# Patient Record
Sex: Female | Born: 1965 | Race: White | Hispanic: Yes | Marital: Single | Smoking: Never smoker
Health system: Southern US, Community
[De-identification: ages and names within clinical notes are randomized; demographics above are authoritative.]

## PROBLEM LIST (undated history)

## (undated) DIAGNOSIS — H9190 Unspecified hearing loss, unspecified ear: Secondary | ICD-10-CM

## (undated) HISTORY — PX: NO PAST SURGERIES: SHX2092

---

## 1999-05-11 ENCOUNTER — Ambulatory Visit (HOSPITAL_COMMUNITY): Admission: RE | Admit: 1999-05-11 | Discharge: 1999-05-11 | Payer: Self-pay | Admitting: *Deleted

## 1999-07-13 ENCOUNTER — Encounter (HOSPITAL_COMMUNITY): Admission: RE | Admit: 1999-07-13 | Discharge: 1999-07-17 | Payer: Self-pay | Admitting: Obstetrics

## 1999-07-16 ENCOUNTER — Inpatient Hospital Stay (HOSPITAL_COMMUNITY): Admission: AD | Admit: 1999-07-16 | Discharge: 1999-07-19 | Payer: Self-pay | Admitting: Obstetrics & Gynecology

## 2000-02-06 ENCOUNTER — Emergency Department (HOSPITAL_COMMUNITY): Admission: EM | Admit: 2000-02-06 | Discharge: 2000-02-06 | Payer: Self-pay | Admitting: Emergency Medicine

## 2000-02-06 ENCOUNTER — Encounter: Payer: Self-pay | Admitting: Emergency Medicine

## 2006-05-17 ENCOUNTER — Inpatient Hospital Stay (HOSPITAL_COMMUNITY): Admission: AD | Admit: 2006-05-17 | Discharge: 2006-05-17 | Payer: Self-pay | Admitting: Obstetrics and Gynecology

## 2020-03-26 ENCOUNTER — Emergency Department (HOSPITAL_COMMUNITY): Payer: Self-pay

## 2020-03-26 ENCOUNTER — Encounter (HOSPITAL_COMMUNITY): Payer: Self-pay

## 2020-03-26 ENCOUNTER — Other Ambulatory Visit: Payer: Self-pay

## 2020-03-26 ENCOUNTER — Observation Stay (HOSPITAL_COMMUNITY)
Admission: EM | Admit: 2020-03-26 | Discharge: 2020-03-28 | Disposition: A | Discharge status: Home / Self Care | Payer: Self-pay | Attending: Physician Assistant | Admitting: Physician Assistant

## 2020-03-26 DIAGNOSIS — Z419 Encounter for procedure for purposes other than remedying health state, unspecified: Secondary | ICD-10-CM

## 2020-03-26 DIAGNOSIS — K819 Cholecystitis, unspecified: Secondary | ICD-10-CM

## 2020-03-26 DIAGNOSIS — Z20822 Contact with and (suspected) exposure to covid-19: Secondary | ICD-10-CM | POA: Insufficient documentation

## 2020-03-26 DIAGNOSIS — K810 Acute cholecystitis: Principal | ICD-10-CM | POA: Insufficient documentation

## 2020-03-26 HISTORY — DX: Unspecified hearing loss, unspecified ear: H91.90

## 2020-03-26 LAB — COMPREHENSIVE METABOLIC PANEL
ALT: 37 U/L (ref 0–44)
AST: 50 U/L — ABNORMAL HIGH (ref 15–41)
Albumin: 4.3 g/dL (ref 3.5–5.0)
Alkaline Phosphatase: 78 U/L (ref 38–126)
Anion gap: 13 (ref 5–15)
BUN: 13 mg/dL (ref 6–20)
CO2: 25 mmol/L (ref 22–32)
Calcium: 9.4 mg/dL (ref 8.9–10.3)
Chloride: 99 mmol/L (ref 98–111)
Creatinine, Ser: 0.8 mg/dL (ref 0.44–1.00)
GFR, Estimated: >60 mL/min (ref >60)
Glucose, Bld: 141 mg/dL — ABNORMAL HIGH (ref 70–99)
Potassium: 4.7 mmol/L (ref 3.5–5.1)
Sodium: 137 mmol/L (ref 135–145)
Total Bilirubin: 2.4 mg/dL — ABNORMAL HIGH (ref 0.3–1.2)
Total Protein: 8.4 g/dL — ABNORMAL HIGH (ref 6.5–8.1)

## 2020-03-26 LAB — URINALYSIS, ROUTINE W REFLEX MICROSCOPIC
Bacteria, UA: NONE SEEN
Bilirubin Urine: NEGATIVE
Glucose, UA: NEGATIVE mg/dL
Ketones, ur: NEGATIVE mg/dL
Leukocytes,Ua: NEGATIVE
Nitrite: NEGATIVE
Protein, ur: NEGATIVE mg/dL
Specific Gravity, Urine: 1.014 (ref 1.005–1.030)
pH: 6 (ref 5.0–8.0)

## 2020-03-26 LAB — RESPIRATORY PANEL BY RT PCR (FLU A&B, COVID)
Influenza A by PCR: NEGATIVE
Influenza B by PCR: NEGATIVE
SARS Coronavirus 2 by RT PCR: NEGATIVE

## 2020-03-26 LAB — CBC
HCT: 46 % (ref 36.0–46.0)
Hemoglobin: 15.2 g/dL — ABNORMAL HIGH (ref 12.0–15.0)
MCH: 28.4 pg (ref 26.0–34.0)
MCHC: 33 g/dL (ref 30.0–36.0)
MCV: 85.8 fL (ref 80.0–100.0)
Platelets: 372 10*3/uL (ref 150–400)
RBC: 5.36 MIL/uL — ABNORMAL HIGH (ref 3.87–5.11)
RDW: 13.1 % (ref 11.5–15.5)
WBC: 23.9 10*3/uL — ABNORMAL HIGH (ref 4.0–10.5)
nRBC: 0 % (ref 0.0–0.2)

## 2020-03-26 LAB — I-STAT BETA HCG BLOOD, ED (MC, WL, AP ONLY): I-stat hCG, quantitative: <5 m[IU]/mL (ref <5)

## 2020-03-26 LAB — LIPASE, BLOOD: Lipase: 22 U/L (ref 11–51)

## 2020-03-26 MED ORDER — DOCUSATE SODIUM 100 MG PO CAPS
100.0000 mg | ORAL_CAPSULE | Freq: Two times a day (BID) | ORAL | Status: DC
  Administered 2020-03-26 – 2020-03-28 (×3): 100 mg via ORAL
  Filled 2020-03-26 (×3): qty 1

## 2020-03-26 MED ORDER — OXYCODONE HCL 5 MG PO TABS
5.0000 mg | ORAL_TABLET | ORAL | Status: DC | PRN
  Administered 2020-03-26: 18:00:00 10 mg via ORAL
  Administered 2020-03-28: 5 mg via ORAL
  Filled 2020-03-26: qty 1
  Filled 2020-03-26: qty 2

## 2020-03-26 MED ORDER — ACETAMINOPHEN 325 MG PO TABS
650.0000 mg | ORAL_TABLET | Freq: Four times a day (QID) | ORAL | Status: DC | PRN
  Administered 2020-03-27: 650 mg via ORAL
  Filled 2020-03-26: qty 2

## 2020-03-26 MED ORDER — METOPROLOL TARTRATE 5 MG/5ML IV SOLN: 5.0000 mg | Freq: Four times a day (QID) | INTRAVENOUS | Status: DC | PRN

## 2020-03-26 MED ORDER — DIPHENHYDRAMINE HCL 50 MG/ML IJ SOLN: 25.0000 mg | Freq: Four times a day (QID) | INTRAMUSCULAR | Status: DC | PRN

## 2020-03-26 MED ORDER — POLYETHYLENE GLYCOL 3350 17 G PO PACK: 17.0000 g | PACK | Freq: Every day | ORAL | Status: DC | PRN

## 2020-03-26 MED ORDER — ONDANSETRON HCL 4 MG/2ML IJ SOLN
4.0000 mg | Freq: Once | INTRAMUSCULAR | Status: AC
  Administered 2020-03-26: 4 mg via INTRAVENOUS
  Filled 2020-03-26: qty 2

## 2020-03-26 MED ORDER — METHOCARBAMOL 500 MG PO TABS: 500.0000 mg | ORAL_TABLET | Freq: Four times a day (QID) | ORAL | Status: DC | PRN

## 2020-03-26 MED ORDER — INFLUENZA VAC SPLIT QUAD 0.5 ML IM SUSY
0.5000 mL | PREFILLED_SYRINGE | INTRAMUSCULAR | Status: DC
  Filled 2020-03-26: qty 0.5

## 2020-03-26 MED ORDER — ACETAMINOPHEN 500 MG PO TABS
1000.0000 mg | ORAL_TABLET | ORAL | Status: AC
  Administered 2020-03-27: 1000 mg via ORAL
  Filled 2020-03-26: qty 2

## 2020-03-26 MED ORDER — BISACODYL 10 MG RE SUPP: 10.0000 mg | Freq: Every day | RECTAL | Status: DC | PRN

## 2020-03-26 MED ORDER — IBUPROFEN 200 MG PO TABS: 600.0000 mg | ORAL_TABLET | Freq: Four times a day (QID) | ORAL | Status: DC | PRN

## 2020-03-26 MED ORDER — DIPHENHYDRAMINE HCL 25 MG PO CAPS: 25.0000 mg | ORAL_CAPSULE | Freq: Four times a day (QID) | ORAL | Status: DC | PRN

## 2020-03-26 MED ORDER — SODIUM CHLORIDE 0.9 % IV SOLN
2.0000 g | Freq: Once | INTRAVENOUS | Status: AC
  Administered 2020-03-26: 2 g via INTRAVENOUS
  Filled 2020-03-26: qty 20

## 2020-03-26 MED ORDER — PANTOPRAZOLE SODIUM 40 MG IV SOLR
40.0000 mg | Freq: Every day | INTRAVENOUS | Status: DC
  Administered 2020-03-26 – 2020-03-27 (×2): 40 mg via INTRAVENOUS
  Filled 2020-03-26 (×2): qty 40

## 2020-03-26 MED ORDER — MORPHINE SULFATE (PF) 2 MG/ML IV SOLN: 2.0000 mg | INTRAVENOUS | Status: DC | PRN

## 2020-03-26 MED ORDER — ENOXAPARIN SODIUM 40 MG/0.4ML ~~LOC~~ SOLN
40.0000 mg | SUBCUTANEOUS | Status: DC
  Administered 2020-03-26 – 2020-03-27 (×2): 40 mg via SUBCUTANEOUS
  Filled 2020-03-26 (×2): qty 0.4

## 2020-03-26 MED ORDER — ONDANSETRON 4 MG PO TBDP: 4.0000 mg | ORAL_TABLET | Freq: Four times a day (QID) | ORAL | Status: DC | PRN

## 2020-03-26 MED ORDER — HYDROMORPHONE HCL 1 MG/ML IJ SOLN
1.0000 mg | Freq: Once | INTRAMUSCULAR | Status: AC
  Administered 2020-03-26: 1 mg via INTRAVENOUS
  Filled 2020-03-26: qty 1

## 2020-03-26 MED ORDER — ACETAMINOPHEN 650 MG RE SUPP: 650.0000 mg | Freq: Four times a day (QID) | RECTAL | Status: DC | PRN

## 2020-03-26 MED ORDER — GABAPENTIN 300 MG PO CAPS
300.0000 mg | ORAL_CAPSULE | ORAL | Status: AC
  Administered 2020-03-27: 300 mg via ORAL
  Filled 2020-03-26: qty 1

## 2020-03-26 MED ORDER — IBUPROFEN 400 MG PO TABS
600.0000 mg | ORAL_TABLET | Freq: Four times a day (QID) | ORAL | Status: DC | PRN
  Administered 2020-03-26: 600 mg via ORAL
  Filled 2020-03-26: qty 1

## 2020-03-26 MED ORDER — ONDANSETRON HCL 4 MG/2ML IJ SOLN: 4.0000 mg | Freq: Four times a day (QID) | INTRAMUSCULAR | Status: DC | PRN

## 2020-03-26 MED ORDER — SODIUM CHLORIDE 0.9 % IV SOLN
1000.0000 mL | INTRAVENOUS | Status: DC
  Administered 2020-03-26 – 2020-03-28 (×4): 1000 mL via INTRAVENOUS

## 2020-03-26 MED ORDER — SODIUM CHLORIDE 0.9 % IV BOLUS (SEPSIS)
1000.0000 mL | Freq: Once | INTRAVENOUS | Status: AC
  Administered 2020-03-26: 1000 mL via INTRAVENOUS

## 2020-03-26 MED ORDER — SODIUM CHLORIDE 0.9 % IV SOLN
2.0000 g | INTRAVENOUS | Status: DC
  Administered 2020-03-27: 2 g via INTRAVENOUS
  Filled 2020-03-26 (×2): qty 20

## 2020-03-26 MED ORDER — SIMETHICONE 80 MG PO CHEW
40.0000 mg | CHEWABLE_TABLET | Freq: Four times a day (QID) | ORAL | Status: DC | PRN
  Filled 2020-03-26: qty 1

## 2020-03-26 MED ORDER — CELECOXIB 200 MG PO CAPS
400.0000 mg | ORAL_CAPSULE | ORAL | Status: AC
  Administered 2020-03-27: 400 mg via ORAL
  Filled 2020-03-26: qty 2

## 2020-03-26 NOTE — ED Notes (Addendum)
Report called to 1318 RN Dahlia Client

## 2020-03-26 NOTE — ED Provider Notes (Signed)
[facility]-EMERGENCY DEPT Provider Note   CSN: [id number] Arrival date & time: 03/26/20  1158   Sign language interpreter used  History Chief Complaint  Patient presents with   Abdominal Pain    Yesenia Olson is a 54 y.o. female.  HPI   Patient presents ED for evaluation of abdominal pain.  Patient had acute onset of pain this morning at about 930.  She started having nausea and vomiting.  Patient also felt very lightheaded.  Patient states the pain is sharp in the right upper quadrant.  Nothing seems to be helping.  She denies any diarrhea.  No dysuria.  She denies any prior abdominal surgeries  History reviewed. No pertinent past medical history.  There are no problems to display for this patient.   History reviewed. No pertinent surgical history.   OB History   No obstetric history on file.     No family history on file.  Social History   Tobacco Use   Smoking status: Never Smoker   Smokeless tobacco: Never Used  Substance Use Topics   Alcohol use: Never   Drug use: Never    Home Medications Prior to Admission medications   Not on File    Allergies    Patient has no known allergies.  Review of Systems   Review of Systems  All other systems reviewed and are negative.   Physical Exam Updated Vital Signs BP (!) 102/51    Pulse (!) 115    Temp 98.7 F (37.1 C) (Oral)    Resp 16    SpO2 98%   Physical Exam Vitals and nursing note reviewed.  Constitutional:      General: She is not in acute distress.    Appearance: She is well-developed.  HENT:     Head: Normocephalic and atraumatic.     Right Ear: External ear normal.     Left Ear: External ear normal.  Eyes:     General: No scleral icterus.       Right eye: No discharge.        Left eye: No discharge.     Conjunctiva/sclera: Conjunctivae normal.  Neck:     Trachea: No tracheal deviation.  Cardiovascular:     Rate and Rhythm: Normal rate and regular rhythm.    Pulmonary:     Effort: Pulmonary effort is normal. No respiratory distress.     Breath sounds: Normal breath sounds. No stridor. No wheezing or rales.  Abdominal:     General: Bowel sounds are normal. There is no distension.     Palpations: Abdomen is soft.     Tenderness: There is abdominal tenderness in the right upper quadrant. There is guarding. There is no rebound. Positive signs include Murphy's sign.  Musculoskeletal:        General: No tenderness.     Cervical back: Neck supple.  Skin:    General: Skin is warm and dry.     Findings: No rash.  Neurological:     Mental Status: She is alert.     Cranial Nerves: No cranial nerve deficit (no facial droop, extraocular movements intact, no slurred speech).     Sensory: No sensory deficit.     Motor: No abnormal muscle tone or seizure activity.     Coordination: Coordination normal.     ED Results / Procedures / Treatments   Labs (all labs ordered are listed, but only abnormal results are displayed) Labs Reviewed  COMPREHENSIVE METABOLIC PANEL -  Abnormal; Notable for the following components:      Result Value   Glucose, Bld 141 (*)    Total Protein 8.4 (*)    AST 50 (*)    Total Bilirubin 2.4 (*)    All other components within normal limits  CBC - Abnormal; Notable for the following components:   WBC 23.9 (*)    RBC 5.36 (*)    Hemoglobin 15.2 (*)    All other components within normal limits  URINALYSIS, ROUTINE W REFLEX MICROSCOPIC - Abnormal; Notable for the following components:   Hgb urine dipstick MODERATE (*)    All other components within normal limits  RESPIRATORY PANEL BY RT PCR (FLU A&B, COVID)  LIPASE, BLOOD  I-STAT BETA HCG BLOOD, ED ([facility], [facility], [facility] ONLY)    EKG None  Radiology US Abdomen Limited  Result Date: 03/26/2020 CLINICAL DATA:  Right upper quadrant pain EXAM: ULTRASOUND ABDOMEN LIMITED RIGHT UPPER QUADRANT COMPARISON:  None. FINDINGS: Gallbladder: Gallbladder is contracted and filled with  calculi and sludge. Gallbladder wall appears somewhat thickened. No pericholecystic fluid. No sonographic Murphy sign noted by sonographer. Common bile duct: Diameter: 4 mm. No intrahepatic or extrahepatic biliary duct dilatation. Liver: No focal lesion identified. Within normal limits in parenchymal echogenicity. Portal vein is patent on color Doppler imaging with normal direction of blood flow towards the liver. Other: None. IMPRESSION: Gallbladder contracted and filled with calculi and sludge. The gallbladder wall is thickened. These findings are concerning for potential degree of acute cholecystitis. Study otherwise unremarkable. Electronically Signed   By: Bretta Bang III M.D.   On: 03/26/2020 16:02    Procedures Procedures (including critical care time)  Medications Ordered in ED Medications  sodium chloride 0.9 % bolus 1,000 mL (0 mLs Intravenous Stopped 03/26/20 1519)    Followed by  0.9 %  sodium chloride infusion (has no administration in time range)  HYDROmorphone (DILAUDID) injection 1 mg (1 mg Intravenous Given 03/26/20 1352)  ondansetron (ZOFRAN) injection 4 mg (4 mg Intravenous Given 03/26/20 1348)  cefTRIAXone (ROCEPHIN) 2 g in sodium chloride 0.9 % 100 mL IVPB (0 g Intravenous Stopped 03/26/20 1432)    ED Course  I have reviewed the triage vital signs and the nursing notes.  Pertinent labs & imaging results that were available during my care of the patient were reviewed by me and considered in my medical decision making (see chart for details).  Clinical Course as of Mar 26 1626  Wed Mar 26, 2020  1338 Symptoms concerning for cholecystitis.  Will proceed with ultrasound   [JK]  1624 [place] confirms cholecystitis.  Pt has been given IV abx.  Discussed findings with patient and son.  Gen surg consulted.   [JK]    Clinical Course User Index [JK] Linwood Dibbles, MD   MDM Rules/Calculators/A&P                          Patient presented to the ED for evaluation of abdominal  pain.  Patient had significant tenderness to the right upper quadrant.  Labs were notable for slight increase in the bilirubin as well as a leukocytosis.  Ultrasound was performed and the patient has evidence of acute cholecystitis.  Patient has remained hemodynamically stable.  No signs of sepsis.  Note blood pressure of 77/62 at 1425 was erroneous.  Patient has remained normotensive throughout.  General surgery has been consulted.  Plan is admission for further treatment. Final Clinical Impression(s) / ED  Diagnoses Final diagnoses:  Cholecystitis      Linwood Dibbles, MD 03/26/20 [phone]

## 2020-03-26 NOTE — ED Triage Notes (Signed)
Pt presents with c/o abdominal pain that started around 9:30 this morning, associated with vomiting. ASL interpreter used.

## 2020-03-26 NOTE — H&P (Signed)
[facility] Admission Note  Yesenia Olson 09-14-1965  [id number].    Requesting MD: Dorie Rank Chief Complaint/Reason for Consult: cholecystitis  HPI:  Yesenia Olson is a 54yo female with no significant PMH who presented to [facility] today complaining of acute onset abdominal pain. States that this episode began last night. She ate a frozen meal for dinner, then was woken up from sleep with RUQ abdominal pain. Pain is constant, severe. Sometimes radiates across her upper abdomen. Associated with nausea, vomiting, and chills. No known fever. She reports having 1 similar episode about 2 days ago, but the pain was less severe and resolved without intervention.  States that she felt lightheaded and passed out in the bathroom, hitting her head on the bathroom sink. Denies headache, blurry vision, neck pain, focal weakness. She called her son who brought her to the ED. ED work up included u/s which shows gallbladder contracted and filled with calculi and sludge, the gallbladder wall is thickened, concerning for potential degree of acute cholecystitis; CBD 75m diameter. WBC 23.9, AST 50, ALT 37, Alk phos 78, Tbili 2.4, lipase 22. General surgery asked to see.  Abdominal surgical history: none Anticoagulants: none Nonsmoker Lives at home alone Employment: housekeeping at [employer] covid vaccine 09/2019 NKDA  Review of Systems  Constitutional: Positive for chills and malaise/fatigue. Negative for fever.  HENT: Negative.   Eyes: Negative.   Respiratory: Negative.   Cardiovascular: Negative.   Gastrointestinal: Positive for abdominal pain, nausea and vomiting. Negative for constipation and diarrhea.  Genitourinary: Negative.   Musculoskeletal: Positive for falls.  Skin: Negative.   Neurological: Positive for loss of consciousness.   All systems reviewed and otherwise negative except for as above  No family history on file.  History reviewed. No pertinent past  medical history.  History reviewed. No pertinent surgical history.  Social History:  reports that she has never smoked. She has never used smokeless tobacco. She reports that she does not drink alcohol and does not use drugs.  Allergies: No Known Allergies  (Not in a hospital admission)   Prior to Admission medications   Not on File    Blood pressure (!) 102/51, pulse (!) 115, temperature 98.7 F (37.1 C), temperature source Oral, resp. rate 16, SpO2 98 %. Physical Exam: General: pleasant, WD/WN white female who is laying in bed in NAD HEENT: head is normocephalic. Mild erythema noted to frontal forehead.  Sclera are noninjected.  PERRL. EOMs intact.  Ears and nose without any masses or lesions.  Mouth is pink and moist. Dentition fair Heart: regular, rate, and rhythm.  Normal s1,s2. No obvious murmurs, gallops, or rubs noted.  Palpable pedal pulses bilaterally  Lungs: CTAB, no wheezes, rhonchi, or rales noted.  Respiratory effort nonlabored Abd: soft, ND, mild TTP RUQ without rebound or guarding, +BS, no masses, hernias, or organomegaly MS: no BUE/BLE edema, calves soft and nontender Skin: warm and dry with no masses, lesions, or rashes Psych: A&Ox4 with an appropriate affect Neuro: nonfocal exam. cranial nerves grossly intact, equal strength in BUE/BLE bilaterally, normal speech, thought process intact  Results for orders placed or performed during the hospital encounter of 03/26/20 (from the past 48 hour(s))  Lipase, blood     Status: None   Collection Time: 03/26/20 12:26 PM  Result Value Ref Range   Lipase 22 11 - 51 U/L    Comment: Performed at [facility] [address], [place] [address] Comprehensive metabolic panel  Status: Abnormal   Collection Time: 03/26/20 12:26 PM  Result Value Ref Range   Sodium 137 135 - 145 mmol/L   Potassium 4.7 3.5 - 5.1 mmol/L   Chloride 99 98 - 111 mmol/L   CO2 25 22 - 32 mmol/L   Glucose, Bld 141 (H) 70  - 99 mg/dL    Comment: Glucose reference range applies only to samples taken after fasting for at least 8 hours.   BUN 13 6 - 20 mg/dL   Creatinine, Ser 0.80 0.44 - 1.00 mg/dL   Calcium 9.4 8.9 - 10.3 mg/dL   Total Protein 8.4 (H) 6.5 - 8.1 g/dL   Albumin 4.3 3.5 - 5.0 g/dL   AST 50 (H) 15 - 41 U/L   ALT 37 0 - 44 U/L   Alkaline Phosphatase 78 38 - 126 U/L   Total Bilirubin 2.4 (H) 0.3 - 1.2 mg/dL   GFR, Estimated >60 >60 mL/min   Anion gap 13 5 - 15    Comment: Performed at [facility], [place] [address], [place], [place] [address]  CBC     Status: Abnormal   Collection Time: 03/26/20 12:26 PM  Result Value Ref Range   WBC 23.9 (H) 4.0 - 10.5 K/uL   RBC 5.36 (H) 3.87 - 5.11 MIL/uL   Hemoglobin 15.2 (H) 12.0 - 15.0 g/dL   HCT 46.0 36 - 46 %   MCV 85.8 80.0 - 100.0 fL   MCH 28.4 26.0 - 34.0 pg   MCHC 33.0 30.0 - 36.0 g/dL   RDW 13.1 11.5 - 15.5 %   Platelets 372 150 - 400 K/uL   nRBC 0.0 0.0 - 0.2 %    Comment: Performed at [facility], [place] [address], [place], [place] [address]  I-Stat beta hCG blood, ED     Status: None   Collection Time: 03/26/20 12:31 PM  Result Value Ref Range   I-stat hCG, quantitative <5.0 <5 mIU/mL   Comment 3            Comment:   GEST. AGE      CONC.  (mIU/mL)   <=1 WEEK        5 - 50     2 WEEKS       50 - 500     3 WEEKS       100 - 10,000     4 WEEKS     1,000 - 30,000        FEMALE AND NON-PREGNANT FEMALE:     LESS THAN 5 mIU/mL   Urinalysis, Routine w reflex microscopic     Status: Abnormal   Collection Time: 03/26/20  1:47 PM  Result Value Ref Range   Color, Urine YELLOW YELLOW   APPearance CLEAR CLEAR   Specific Gravity, Urine 1.014 1.005 - 1.030   pH 6.0 5.0 - 8.0   Glucose, UA NEGATIVE NEGATIVE mg/dL   Hgb urine dipstick MODERATE (A) NEGATIVE   Bilirubin Urine NEGATIVE NEGATIVE   Ketones, ur NEGATIVE NEGATIVE mg/dL   Protein, ur NEGATIVE NEGATIVE mg/dL   Nitrite NEGATIVE NEGATIVE    Leukocytes,Ua NEGATIVE NEGATIVE   RBC / HPF 6-10 0 - 5 RBC/hpf   WBC, UA 0-5 0 - 5 WBC/hpf   Bacteria, UA NONE SEEN NONE SEEN   Squamous Epithelial / LPF 0-5 0 - 5   Mucus PRESENT     Comment: Performed at [facility], [address] [place], [place]  [address]   US Abdomen Limited  Result Date: 03/26/2020 CLINICAL DATA:  Right upper quadrant pain EXAM: ULTRASOUND ABDOMEN LIMITED RIGHT UPPER QUADRANT COMPARISON:  None. FINDINGS: Gallbladder: Gallbladder is contracted and filled with calculi and sludge. Gallbladder wall appears somewhat thickened. No pericholecystic fluid. No sonographic Murphy sign noted by sonographer. Common bile duct: Diameter: 4 mm. No intrahepatic or extrahepatic biliary duct dilatation. Liver: No focal lesion identified. Within normal limits in parenchymal echogenicity. Portal vein is patent on color Doppler imaging with normal direction of blood flow towards the liver. Other: None. IMPRESSION: Gallbladder contracted and filled with calculi and sludge. The gallbladder wall is thickened. These findings are concerning for potential degree of acute cholecystitis. Study otherwise unremarkable. Electronically Signed   By: Lowella Grip III M.D.   On: 03/26/2020 16:02      Assessment/Plan Deaf - speaks sign language Elevated glucose - check A1c  Acute cholecystitis - Patient with clinical and radiographic findings consistent with acute cholecystitis. Will admit to med-surg for observation. Start IV rocephin. Plan for laparoscopic cholecystectomy with possible IOC tomorrow. Bilirubin is elevated at 2.4 but CBD is only 101m in diameter, will trend with repeat labwork in the AM.  ID - rocephin VTE - SCDs, lovenox FEN - IVF, CLD, NPO after midnight Foley - none Follow up - TBD  [facility] 03/26/2020, 4:47 PM Please see Amion for pager number during day hours 7:00am-4:30pm

## 2020-03-27 ENCOUNTER — Encounter (HOSPITAL_COMMUNITY): Payer: Self-pay

## 2020-03-27 ENCOUNTER — Observation Stay (HOSPITAL_COMMUNITY): Payer: Self-pay | Admitting: Certified Registered Nurse Anesthetist

## 2020-03-27 ENCOUNTER — Encounter (HOSPITAL_COMMUNITY)
Admission: EM | Disposition: A | Discharge status: Home / Self Care | Payer: Self-pay | Source: Home / Self Care | Attending: Emergency Medicine

## 2020-03-27 ENCOUNTER — Observation Stay (HOSPITAL_COMMUNITY): Payer: Self-pay

## 2020-03-27 HISTORY — PX: CHOLECYSTECTOMY: SHX55

## 2020-03-27 LAB — CBC
HCT: 37.7 % (ref 36.0–46.0)
Hemoglobin: 12.4 g/dL (ref 12.0–15.0)
MCH: 28.7 pg (ref 26.0–34.0)
MCHC: 32.9 g/dL (ref 30.0–36.0)
MCV: 87.3 fL (ref 80.0–100.0)
Platelets: 287 10*3/uL (ref 150–400)
RBC: 4.32 MIL/uL (ref 3.87–5.11)
RDW: 13.4 % (ref 11.5–15.5)
WBC: 13.5 10*3/uL — ABNORMAL HIGH (ref 4.0–10.5)
nRBC: 0 % (ref 0.0–0.2)

## 2020-03-27 LAB — COMPREHENSIVE METABOLIC PANEL
ALT: 93 U/L — ABNORMAL HIGH (ref 0–44)
AST: 123 U/L — ABNORMAL HIGH (ref 15–41)
Albumin: 3.1 g/dL — ABNORMAL LOW (ref 3.5–5.0)
Alkaline Phosphatase: 70 U/L (ref 38–126)
Anion gap: 9 (ref 5–15)
BUN: 9 mg/dL (ref 6–20)
CO2: 23 mmol/L (ref 22–32)
Calcium: 8.6 mg/dL — ABNORMAL LOW (ref 8.9–10.3)
Chloride: 105 mmol/L (ref 98–111)
Creatinine, Ser: 0.71 mg/dL (ref 0.44–1.00)
GFR, Estimated: >60 mL/min (ref >60)
Glucose, Bld: 108 mg/dL — ABNORMAL HIGH (ref 70–99)
Potassium: 4 mmol/L (ref 3.5–5.1)
Sodium: 137 mmol/L (ref 135–145)
Total Bilirubin: 1.2 mg/dL (ref 0.3–1.2)
Total Protein: 6.3 g/dL — ABNORMAL LOW (ref 6.5–8.1)

## 2020-03-27 LAB — HIV ANTIBODY (ROUTINE TESTING W REFLEX): HIV Screen 4th Generation wRfx: NONREACTIVE

## 2020-03-27 LAB — SURGICAL PCR SCREEN
MRSA, PCR: NEGATIVE
Staphylococcus aureus: POSITIVE — AB

## 2020-03-27 SURGERY — LAPAROSCOPIC CHOLECYSTECTOMY WITH INTRAOPERATIVE CHOLANGIOGRAM
Anesthesia: General

## 2020-03-27 MED ORDER — DEXAMETHASONE SODIUM PHOSPHATE 10 MG/ML IJ SOLN
INTRAMUSCULAR | Status: AC
  Filled 2020-03-27: qty 1

## 2020-03-27 MED ORDER — PROPOFOL 10 MG/ML IV BOLUS
INTRAVENOUS | Status: DC | PRN
  Administered 2020-03-27: 140 mg via INTRAVENOUS

## 2020-03-27 MED ORDER — BUPIVACAINE-EPINEPHRINE 0.5% -1:200000 IJ SOLN
INTRAMUSCULAR | Status: DC | PRN
  Administered 2020-03-27: 30 mL

## 2020-03-27 MED ORDER — HYDROMORPHONE HCL 1 MG/ML IJ SOLN: 0.2500 mg | INTRAMUSCULAR | Status: DC | PRN

## 2020-03-27 MED ORDER — DEXAMETHASONE SODIUM PHOSPHATE 10 MG/ML IJ SOLN
INTRAMUSCULAR | Status: DC | PRN
  Administered 2020-03-27: 6 mg via INTRAVENOUS

## 2020-03-27 MED ORDER — BUPIVACAINE-EPINEPHRINE (PF) 0.5% -1:200000 IJ SOLN
INTRAMUSCULAR | Status: AC
  Filled 2020-03-27: qty 30

## 2020-03-27 MED ORDER — LACTATED RINGERS IV SOLN: INTRAVENOUS | Status: DC

## 2020-03-27 MED ORDER — SCOPOLAMINE 1 MG/3DAYS TD PT72: 1.0000 | MEDICATED_PATCH | TRANSDERMAL | Status: DC

## 2020-03-27 MED ORDER — ONDANSETRON HCL 4 MG/2ML IJ SOLN
INTRAMUSCULAR | Status: DC | PRN
  Administered 2020-03-27: 4 mg via INTRAVENOUS

## 2020-03-27 MED ORDER — OXYCODONE HCL 5 MG/5ML PO SOLN: 5.0000 mg | Freq: Once | ORAL | Status: DC | PRN

## 2020-03-27 MED ORDER — ROCURONIUM BROMIDE 10 MG/ML (PF) SYRINGE
PREFILLED_SYRINGE | INTRAVENOUS | Status: DC | PRN
  Administered 2020-03-27: 10 mg via INTRAVENOUS
  Administered 2020-03-27: 30 mg via INTRAVENOUS

## 2020-03-27 MED ORDER — SCOPOLAMINE 1 MG/3DAYS TD PT72
MEDICATED_PATCH | TRANSDERMAL | Status: AC
  Administered 2020-03-27: 1.5 mg
  Filled 2020-03-27: qty 1

## 2020-03-27 MED ORDER — MIDAZOLAM HCL 2 MG/2ML IJ SOLN
INTRAMUSCULAR | Status: AC
  Filled 2020-03-27: qty 2

## 2020-03-27 MED ORDER — PROMETHAZINE HCL 25 MG/ML IJ SOLN: 6.2500 mg | INTRAMUSCULAR | Status: DC | PRN

## 2020-03-27 MED ORDER — SUCCINYLCHOLINE CHLORIDE 200 MG/10ML IV SOSY
PREFILLED_SYRINGE | INTRAVENOUS | Status: AC
  Filled 2020-03-27: qty 10

## 2020-03-27 MED ORDER — LACTATED RINGERS IR SOLN
Status: DC | PRN
  Administered 2020-03-27: 1000 mL

## 2020-03-27 MED ORDER — OXYCODONE HCL 5 MG PO TABS: 5.0000 mg | ORAL_TABLET | Freq: Once | ORAL | Status: DC | PRN

## 2020-03-27 MED ORDER — SODIUM CHLORIDE 0.9 % IV SOLN
INTRAVENOUS | Status: DC | PRN
  Administered 2020-03-27: 8 mL

## 2020-03-27 MED ORDER — ONDANSETRON HCL 4 MG/2ML IJ SOLN
INTRAMUSCULAR | Status: AC
  Filled 2020-03-27: qty 2

## 2020-03-27 MED ORDER — LIDOCAINE 2% (20 MG/ML) 5 ML SYRINGE
INTRAMUSCULAR | Status: DC | PRN
  Administered 2020-03-27: 50 mg via INTRAVENOUS

## 2020-03-27 MED ORDER — PHENYLEPHRINE 40 MCG/ML (10ML) SYRINGE FOR IV PUSH (FOR BLOOD PRESSURE SUPPORT)
PREFILLED_SYRINGE | INTRAVENOUS | Status: DC | PRN
  Administered 2020-03-27 (×2): 80 ug via INTRAVENOUS
  Administered 2020-03-27: 120 ug via INTRAVENOUS
  Administered 2020-03-27: 80 ug via INTRAVENOUS
  Administered 2020-03-27: 40 ug via INTRAVENOUS

## 2020-03-27 MED ORDER — SUCCINYLCHOLINE CHLORIDE 200 MG/10ML IV SOSY
PREFILLED_SYRINGE | INTRAVENOUS | Status: DC | PRN
  Administered 2020-03-27: 120 mg via INTRAVENOUS

## 2020-03-27 MED ORDER — MIDAZOLAM HCL 5 MG/5ML IJ SOLN
INTRAMUSCULAR | Status: DC | PRN
  Administered 2020-03-27: 2 mg via INTRAVENOUS

## 2020-03-27 MED ORDER — SUGAMMADEX SODIUM 200 MG/2ML IV SOLN
INTRAVENOUS | Status: DC | PRN
  Administered 2020-03-27: 131.6 mg via INTRAVENOUS

## 2020-03-27 MED ORDER — EPHEDRINE SULFATE-NACL 50-0.9 MG/10ML-% IV SOSY
PREFILLED_SYRINGE | INTRAVENOUS | Status: DC | PRN
  Administered 2020-03-27: 5 mg via INTRAVENOUS

## 2020-03-27 MED ORDER — FENTANYL CITRATE (PF) 250 MCG/5ML IJ SOLN
INTRAMUSCULAR | Status: AC
  Filled 2020-03-27: qty 5

## 2020-03-27 MED ORDER — FENTANYL CITRATE (PF) 100 MCG/2ML IJ SOLN
INTRAMUSCULAR | Status: DC | PRN
  Administered 2020-03-27 (×2): 50 ug via INTRAVENOUS
  Administered 2020-03-27: 100 ug via INTRAVENOUS
  Administered 2020-03-27 (×2): 50 ug via INTRAVENOUS

## 2020-03-27 MED ORDER — FENTANYL CITRATE (PF) 100 MCG/2ML IJ SOLN
INTRAMUSCULAR | Status: AC
  Filled 2020-03-27: qty 2

## 2020-03-27 MED ORDER — 0.9 % SODIUM CHLORIDE (POUR BTL) OPTIME
TOPICAL | Status: DC | PRN
  Administered 2020-03-27: 1000 mL

## 2020-03-27 MED ORDER — LACTATED RINGERS IV SOLN: INTRAVENOUS | Status: DC | PRN

## 2020-03-27 SURGICAL SUPPLY — 49 items
APL PRP STRL LF DISP 70% ISPRP (MISCELLANEOUS) ×1
APL SKNCLS STERI-STRIP NONHPOA (GAUZE/BANDAGES/DRESSINGS) ×1
APPLIER CLIP 5 13 M/L LIGAMAX5 (MISCELLANEOUS)
APPLIER CLIP ROT 10 11.4 M/L (STAPLE) ×3
APR CLP MED LRG 11.4X10 (STAPLE) ×1
APR CLP MED LRG 5 ANG JAW (MISCELLANEOUS)
BAG SPEC RTRVL 10 TROC 200 (ENDOMECHANICALS) ×1
BENZOIN TINCTURE PRP APPL 2/3 (GAUZE/BANDAGES/DRESSINGS) ×3 IMPLANT
BNDG ADH 1X3 SHEER STRL LF (GAUZE/BANDAGES/DRESSINGS) ×12 IMPLANT
BNDG ADH THN 3X1 STRL LF (GAUZE/BANDAGES/DRESSINGS) ×4
CABLE HIGH FREQUENCY MONO STRZ (ELECTRODE) ×3 IMPLANT
CHLORAPREP W/TINT 26 (MISCELLANEOUS) ×3 IMPLANT
CLIP APPLIE 5 13 M/L LIGAMAX5 (MISCELLANEOUS) IMPLANT
CLIP APPLIE ROT 10 11.4 M/L (STAPLE) IMPLANT
CLIP VESOLOCK LG 6/CT PURPLE (CLIP) IMPLANT
CLIP VESOLOCK MED LG 6/CT (CLIP) IMPLANT
CLOSURE WOUND 1/2 X4 (GAUZE/BANDAGES/DRESSINGS) ×1
COVER MAYO STAND STRL (DRAPES) ×3 IMPLANT
COVER SURGICAL LIGHT HANDLE (MISCELLANEOUS) ×3 IMPLANT
COVER WAND RF STERILE (DRAPES) IMPLANT
DECANTER SPIKE VIAL GLASS SM (MISCELLANEOUS) ×1 IMPLANT
DRAIN CHANNEL 19F RND (DRAIN) IMPLANT
DRAPE C-ARM 42X120 X-RAY (DRAPES) ×2 IMPLANT
EVACUATOR SILICONE 100CC (DRAIN) IMPLANT
GLOVE BIOGEL PI IND STRL 7.0 (GLOVE) ×1 IMPLANT
GLOVE BIOGEL PI INDICATOR 7.0 (GLOVE) ×2
GLOVE SURG SS PI 7.0 STRL IVOR (GLOVE) ×3 IMPLANT
GOWN STRL REUS W/TWL LRG LVL3 (GOWN DISPOSABLE) ×3 IMPLANT
GOWN STRL REUS W/TWL XL LVL3 (GOWN DISPOSABLE) ×6 IMPLANT
GRASPER SUT TROCAR 14GX15 (MISCELLANEOUS) IMPLANT
KIT BASIN OR (CUSTOM PROCEDURE TRAY) ×3 IMPLANT
KIT TURNOVER KIT A (KITS) ×2 IMPLANT
POUCH RETRIEVAL ECOSAC 10 (ENDOMECHANICALS) ×1 IMPLANT
POUCH RETRIEVAL ECOSAC 10MM (ENDOMECHANICALS) ×3
SCISSORS LAP 5X35 DISP (ENDOMECHANICALS) ×3 IMPLANT
SET CHOLANGIOGRAPH MIX (MISCELLANEOUS) ×2 IMPLANT
SET IRRIG TUBING LAPAROSCOPIC (IRRIGATION / IRRIGATOR) ×3 IMPLANT
SET TUBE SMOKE EVAC HIGH FLOW (TUBING) ×3 IMPLANT
SLEEVE XCEL OPT CAN 5 100 (ENDOMECHANICALS) ×6 IMPLANT
STOPCOCK 4 WAY LG BORE MALE ST (IV SETS) IMPLANT
STRIP CLOSURE SKIN 1/2X4 (GAUZE/BANDAGES/DRESSINGS) ×2 IMPLANT
SUT ETHILON 2 0 PS N (SUTURE) IMPLANT
SUT MNCRL AB 4-0 PS2 18 (SUTURE) ×3 IMPLANT
SUT VICRYL 0 ENDOLOOP (SUTURE) IMPLANT
TOWEL OR 17X26 10 PK STRL BLUE (TOWEL DISPOSABLE) ×3 IMPLANT
TOWEL OR NON WOVEN STRL DISP B (DISPOSABLE) IMPLANT
TRAY LAPAROSCOPIC (CUSTOM PROCEDURE TRAY) ×3 IMPLANT
TROCAR BLADELESS OPT 5 100 (ENDOMECHANICALS) ×3 IMPLANT
TROCAR XCEL NON-BLD 11X100MML (ENDOMECHANICALS) ×3 IMPLANT

## 2020-03-27 NOTE — Anesthesia Preprocedure Evaluation (Addendum)
Anesthesia Evaluation  Patient identified by MRN, date of birth, ID band Patient awake    Reviewed: Allergy & Precautions, NPO status , Patient's Chart, lab work & pertinent test results, reviewed documented beta blocker date and time   Airway Mallampati: I  TM Distance: >3 FB Neck ROM: Full    Dental no notable dental hx. (+) Teeth Intact   Pulmonary neg pulmonary ROS,    Pulmonary exam normal breath sounds clear to auscultation       Cardiovascular negative cardio ROS Normal cardiovascular exam Rhythm:Regular Rate:Normal     Neuro/Psych Patient is Deaf- reads lips and uses sign language negative psych ROS   GI/Hepatic Elevated LFT's Cholelithiasis with acute cholecysitits   Endo/Other  negative endocrine ROS  Renal/GU negative Renal ROS  negative genitourinary   Musculoskeletal negative musculoskeletal ROS (+)   Abdominal   Peds  Hematology negative hematology ROS (+)   Anesthesia Other Findings   Reproductive/Obstetrics                            Anesthesia Physical Anesthesia Plan  ASA: II  Anesthesia Plan: General   Post-op Pain Management:    Induction: Intravenous, Rapid sequence and Cricoid pressure planned  PONV Risk Score and Plan: 4 or greater and Scopolamine patch - Pre-op, Ondansetron, Treatment may vary due to age or medical condition, Dexamethasone and Midazolam  Airway Management Planned: Oral ETT  Additional Equipment:   Intra-op Plan:   Post-operative Plan: Extubation in OR  Informed Consent: I have reviewed the patients History and Physical, chart, labs and discussed the procedure including the risks, benefits and alternatives for the proposed anesthesia with the patient or authorized representative who has indicated his/her understanding and acceptance.     Dental advisory given  Plan Discussed with: CRNA and Anesthesiologist  Anesthesia Plan  Comments: (Sign language interpreter used for Preop evaluation.)       Anesthesia Quick Evaluation

## 2020-03-27 NOTE — Transfer of Care (Signed)
Immediate Anesthesia Transfer of Care Note  Patient: Yesenia Olson  Procedure(s) Performed: LAPAROSCOPIC CHOLECYSTECTOMY WITH INTRAOPERATIVE CHOLANGIOGRAM (N/A )  Patient Location: PACU  Anesthesia Type:General  Level of Consciousness: drowsy and patient cooperative  Airway & Oxygen Therapy: Patient Spontanous Breathing and Patient connected to face mask oxygen  Post-op Assessment: Report given to RN and Post -op Vital signs reviewed and stable  Post vital signs: Reviewed and stable  Last Vitals:  Vitals Value Taken Time  BP 106/54 03/27/20 1232  Temp    Pulse 81 03/27/20 1233  Resp 10 03/27/20 1233  SpO2 100 % 03/27/20 1233  Vitals shown include unvalidated device data.  Last Pain:  Vitals:   03/27/20 1013  TempSrc:   PainSc: 2       Patients Stated Pain Goal: 4 (03/27/20 1013)  Complications: No complications documented.

## 2020-03-27 NOTE — Op Note (Signed)
PATIENT:  Yesenia Olson  54 y.o. female  PRE-OPERATIVE DIAGNOSIS:  ACUTE CHOLECYSTITIS  POST-OPERATIVE DIAGNOSIS:  ACUTE CHOLECYSTITIS  PROCEDURE:  Procedure(s): LAPAROSCOPIC CHOLECYSTECTOMY WITH INTRAOPERATIVE CHOLANGIOGRAM   SURGEON:  Surgeon(s): Vasilis Luhman, De Blanch, MD  ASSISTANT: Carlena Bjornstad, PAC  ANESTHESIA:   local and general  Indications for procedure: [name] is a 54 y.o. female with symptoms of Abdominal pain and Nausea and vomiting consistent with gallbladder disease, Confirmed by Ultrasound.  Description of procedure: The patient was brought into the operative suite, placed supine. Anesthesia was administered with endotracheal tube. Patient was strapped in place and foot board was secured. All pressure points were offloaded by foam padding. The patient was prepped and draped in the usual sterile fashion.  A small incision was made to the right of the umbilicus. A 70mm trocar was inserted into the peritoneal cavity with optical entry. Pneumoperitoneum was applied with high flow low pressure. 2 73mm trocars were placed in the RUQ. A 21mm trocar was placed in the subxiphoid space. Marcaine was infused to the subxiphoid space and lateral upper right abdomen in the transversus abdominis plane. Next the patient was placed in reverse trendelenberg. The gallbladder was white and had multiple adhesions to the body and fundus consistent with chronic inflammation.  The gallbladder was retracted cephalad and lateral. The peritoneum was reflected off the infundibulum working lateral to medial. The cystic duct and cystic artery were identified and further dissection revealed a critical view, due to concern for choledocholithiasis a cholangiogram was performed with ductotomy and cook catheter passed through a separate subcostal stab incision. Initially there was a delay to duodenal emptying but on second view there was normal emptying consistent with normal ductal anatomy. The  cystic duct and cystic artery were doubly clipped and ligated.   The gallbladder was removed off the liver bed with cautery. The Gallbladder was placed in a specimen bag. The gallbladder fossa was irrigated and hemostasis was applied with cautery. The gallbladder was removed via the 73mm trocar. No dilation was required for removal, therefore no fascial closure was performed. Pneumoperitoneum was removed, all trocar were removed. All incisions were closed with 4-0 monocryl subcuticular stitch. The patient woke from anesthesia and was brought to PACU in stable condition. All counts were correct  Findings: chronic calculous cholecystitis  Specimen: gallbladder  Blood loss: 20 ml  Local anesthesia: 30 ml marcaine  Complications: none  PLAN OF CARE: Admit to inpatient   PATIENT DISPOSITION:  PACU - hemodynamically stable.  Feliciana Rossetti, M.D. General, Bariatric, & Minimally Invasive Surgery [facility]

## 2020-03-27 NOTE — Anesthesia Postprocedure Evaluation (Signed)
Anesthesia Post Note  Patient: Yesenia Olson  Procedure(s) Performed: LAPAROSCOPIC CHOLECYSTECTOMY WITH INTRAOPERATIVE CHOLANGIOGRAM (N/A )     Patient location during evaluation: PACU Anesthesia Type: General Level of consciousness: awake and alert and oriented Pain management: pain level controlled Vital Signs Assessment: post-procedure vital signs reviewed and stable Respiratory status: spontaneous breathing, nonlabored ventilation and respiratory function stable Cardiovascular status: blood pressure returned to baseline and stable Postop Assessment: no apparent nausea or vomiting Anesthetic complications: no   No complications documented.  Last Vitals:  Vitals:   03/27/20 1245 03/27/20 1300  BP: 102/61 (!) 105/57  Pulse: 77 96  Resp: 17 17  Temp:    SpO2: 100% 100%    Last Pain:  Vitals:   03/27/20 1245  TempSrc:   PainSc: Asleep                 Yesenia Olson,Yesenia Madarang A.

## 2020-03-27 NOTE — Progress Notes (Signed)
Pre Procedure note for inpatients:   Yesenia Olson has been scheduled for Procedure(s): LAPAROSCOPIC CHOLECYSTECTOMY WITH POSSIBLE INTRAOPERATIVE CHOLANGIOGRAM (N/A) today. The various methods of treatment have been discussed with the patient. After consideration of the risks, benefits and treatment options the patient has consented to the planned procedure.   The patient has been seen and labs reviewed. There are no changes in the patient's condition to prevent proceeding with the planned procedure today.  Recent labs:  Lab Results  Component Value Date   WBC 13.5 (H) 03/27/2020   HGB 12.4 03/27/2020   HCT 37.7 03/27/2020   PLT 287 03/27/2020   GLUCOSE 108 (H) 03/27/2020   ALT 93 (H) 03/27/2020   AST 123 (H) 03/27/2020   NA 137 03/27/2020   K 4.0 03/27/2020   CL 105 03/27/2020   CREATININE 0.71 03/27/2020   BUN 9 03/27/2020   CO2 23 03/27/2020    Rodman Pickle, MD 03/27/2020 10:10 AM

## 2020-03-27 NOTE — Discharge Instructions (Signed)
[facility] ° °Please arrive at least 30 min before your appointment to complete your check in paperwork.  If you are unable to arrive 30 min prior to your appointment time we may have to cancel or reschedule you. °LAPAROSCOPIC SURGERY: POST OP INSTRUCTIONS °Always review your discharge instruction sheet given to you by the facility where your surgery was performed. °IF YOU HAVE DISABILITY OR FAMILY LEAVE FORMS, YOU MUST BRING THEM TO THE OFFICE FOR PROCESSING.   °DO NOT GIVE THEM TO YOUR DOCTOR. ° °PAIN CONTROL ° °1. First take acetaminophen (Tylenol) AND/or ibuprofen (Advil) to control your pain after surgery.  Follow directions on package.  Taking acetaminophen (Tylenol) and/or ibuprofen (Advil) regularly after surgery will help to control your pain and lower the amount of prescription pain medication you may need.  You should not take more than 4,000 mg (4 grams) of acetaminophen (Tylenol) in 24 hours.  You should not take ibuprofen (Advil), aleve, motrin, naprosyn or other NSAIDS if you have a history of stomach ulcers or chronic kidney disease.  °2. A prescription for pain medication may be given to you upon discharge.  Take your pain medication as prescribed, if you still have uncontrolled pain after taking acetaminophen (Tylenol) or ibuprofen (Advil). °3. Use ice packs to help control pain. °4. If you need a refill on your pain medication, please contact your pharmacy.  They will contact our office to request authorization. Prescriptions will not be filled after 5pm or on week-ends. ° °HOME MEDICATIONS °5. Take your usually prescribed medications unless otherwise directed. ° °DIET °6. You should follow a light diet the first few days after arrival home.  Be sure to include lots of fluids daily. Avoid fatty, fried foods.  ° °CONSTIPATION °7. It is common to experience some constipation after surgery and if you are taking pain medication.  Increasing fluid intake and taking a stool  softener (such as Colace) will usually help or prevent this problem from occurring.  A mild laxative (Milk of Magnesia or Miralax) should be taken according to package instructions if there are no bowel movements after 48 hours. ° °WOUND/INCISION CARE °8. Most patients will experience some swelling and bruising in the area of the incisions.  Ice packs will help.  Swelling and bruising can take several days to resolve.  °9. Unless discharge instructions indicate otherwise, follow guidelines below  °a. STERI-STRIPS - you may remove your outer bandages 48 hours after surgery, and you may shower at that time.  You have steri-strips (small skin tapes) in place directly over the incision.  These strips should be left on the skin for 7-10 days.   °b. DERMABOND/SKIN GLUE - you may shower in 24 hours.  The glue will flake off over the next 2-3 weeks. °10. Any sutures or staples will be removed at the office during your follow-up visit. ° °ACTIVITIES °11. You may resume regular (light) daily activities beginning the next day--such as daily self-care, walking, climbing stairs--gradually increasing activities as tolerated.  You may have sexual intercourse when it is comfortable.  Refrain from any heavy lifting or straining until approved by your doctor. °a. You may drive when you are no longer taking prescription pain medication, you can comfortably wear a seatbelt, and you can safely maneuver your car and apply brakes. ° °FOLLOW-UP °12. You should see your doctor in the office for a follow-up appointment approximately 2-3 weeks after your surgery.  You should have been given your post-op/follow-up appointment when   your surgery was scheduled.  If you did not receive a post-op/follow-up appointment, make sure that you call for this appointment within a day or two after you arrive home to insure a convenient appointment time. ° °OTHER INSTRUCTIONS ° °WHEN TO CALL YOUR DOCTOR: °1. Fever over 101.0 °2. Inability to  urinate °3. Continued bleeding from incision. °4. Increased pain, redness, or drainage from the incision. °5. Increasing abdominal pain ° °The clinic staff is available to answer your questions during regular business hours.  Please don’t hesitate to call and ask to speak to one of the nurses for clinical concerns.  If you have a medical emergency, go to the nearest emergency room or call 911.  A surgeon from [facility] is always on call at the hospital. °[address], [address], [place], [place]  [address] ? [address] [address], [place], [place]   [address] °[phone] ? [phone] ? FAX [phone] ° ° ° °

## 2020-03-27 NOTE — Progress Notes (Signed)
Patient pre procedure checklist completed with ASL interpreter Yesenia Olson with [employer] remotely.  Patient states through interpreter she is not and has never had allergies with Latex

## 2020-03-27 NOTE — Anesthesia Procedure Notes (Signed)
Procedure Name: Intubation Date/Time: 03/27/2020 11:18 AM Performed by: Montel Clock, CRNA Pre-anesthesia Checklist: Patient identified, Emergency Drugs available, Suction available, Patient being monitored and Timeout performed Patient Re-evaluated:Patient Re-evaluated prior to induction Oxygen Delivery Method: Circle system utilized Preoxygenation: Pre-oxygenation with 100% oxygen Induction Type: IV induction, Rapid sequence and Cricoid Pressure applied Laryngoscope Size: Mac and 3 Grade View: Grade I Tube type: Oral Tube size: 7.0 mm Number of attempts: 1 Airway Equipment and Method: Stylet Placement Confirmation: ETT inserted through vocal cords under direct vision,  positive ETCO2 and breath sounds checked- equal and bilateral Secured at: 21 cm Tube secured with: Tape Dental Injury: Teeth and Oropharynx as per pre-operative assessment

## 2020-03-28 ENCOUNTER — Encounter (HOSPITAL_COMMUNITY): Payer: Self-pay | Admitting: General Surgery

## 2020-03-28 LAB — HEMOGLOBIN A1C
Hgb A1c MFr Bld: 5.9 % — ABNORMAL HIGH (ref 4.8–5.6)
Mean Plasma Glucose: 123 mg/dL

## 2020-03-28 LAB — SURGICAL PATHOLOGY

## 2020-03-28 MED ORDER — ACETAMINOPHEN 325 MG PO TABS: 650.0000 mg | ORAL_TABLET | Freq: Four times a day (QID) | ORAL | Status: AC | PRN

## 2020-03-28 MED ORDER — IBUPROFEN 200 MG PO TABS: 600.0000 mg | ORAL_TABLET | Freq: Four times a day (QID) | ORAL | Status: AC | PRN

## 2020-03-28 MED ORDER — POLYETHYLENE GLYCOL 3350 17 G PO PACK: 17.0000 g | PACK | Freq: Every day | ORAL | 0 refills | Status: AC | PRN

## 2020-03-28 MED ORDER — MORPHINE SULFATE (PF) 2 MG/ML IV SOLN: 2.0000 mg | INTRAVENOUS | Status: DC | PRN

## 2020-03-28 MED ORDER — OXYCODONE HCL 5 MG PO TABS: 5.0000 mg | ORAL_TABLET | Freq: Four times a day (QID) | ORAL | 0 refills | Status: AC | PRN

## 2020-03-28 NOTE — Discharge Summary (Signed)
[facility] Discharge Summary   Patient ID: Yesenia Olson MRN: [id number] DOB/AGE: 1965-09-02 54 y.o.  Admit date: 03/26/2020 Discharge date: 03/28/2020  Admitting Diagnosis: Acute cholecystitis  Discharge Diagnosis Patient Active Problem List   Diagnosis Date Noted   Acute cholecystitis 03/26/2020    Consultants None  Imaging: DG Cholangiogram Operative  Result Date: 03/27/2020 CLINICAL DATA:  Intraoperative cholangiogram during laparoscopic cholecystectomy. EXAM: INTRAOPERATIVE CHOLANGIOGRAM FLUOROSCOPY TIME:  23 seconds COMPARISON:  Right upper quadrant abdominal ultrasound-03/26/2020 FINDINGS: Intraoperative cholangiographic images of the right upper abdominal quadrant during laparoscopic cholecystectomy are provided for review. Surgical clips overlie the expected location of the gallbladder fossa. Contrast injection demonstrates selective cannulation of the central aspect of the cystic duct. There is passage of contrast through the central aspect of the cystic duct with filling of a non dilated common bile duct. There is a ventral passage of contrast though the CBD and into the descending portion of the duodenum. There is minimal reflux of injected contrast into the common hepatic duct and central aspect of the non dilated intrahepatic biliary system. There are no discrete filling defects within the opacified portions of the biliary system to suggest the presence of choledocholithiasis. IMPRESSION: No definite evidence of choledocholithiasis. Electronically Signed   By: Simonne Come M.D.   On: 03/27/2020 12:23   US Abdomen Limited  Result Date: 03/26/2020 CLINICAL DATA:  Right upper quadrant pain EXAM: ULTRASOUND ABDOMEN LIMITED RIGHT UPPER QUADRANT COMPARISON:  None. FINDINGS: Gallbladder: Gallbladder is contracted and filled with calculi and sludge. Gallbladder wall appears somewhat thickened. No pericholecystic fluid. No sonographic Murphy sign noted by  sonographer. Common bile duct: Diameter: 4 mm. No intrahepatic or extrahepatic biliary duct dilatation. Liver: No focal lesion identified. Within normal limits in parenchymal echogenicity. Portal vein is patent on color Doppler imaging with normal direction of blood flow towards the liver. Other: None. IMPRESSION: Gallbladder contracted and filled with calculi and sludge. The gallbladder wall is thickened. These findings are concerning for potential degree of acute cholecystitis. Study otherwise unremarkable. Electronically Signed   By: Bretta Bang III M.D.   On: 03/26/2020 16:02    Procedures Dr. Sheliah Hatch (03/27/2020) - Laparoscopic Cholecystectomy with [facility]  Hospital Course:  Yesenia Olson is a 53yo female who presented to [facility]/20 with acute onset abdominal pain, nausea, vomiting.  Workup showed acute cholecystitis.  Patient was admitted and underwent procedure listed above.  Tolerated procedure well and was transferred to the floor.  Diet was advanced as tolerated.  On POD1 the patient was voiding well, tolerating diet, ambulating well, pain well controlled, vital signs stable, incisions c/d/i and felt stable for discharge home.  Patient will follow up as below and knows to call with questions or concerns.    I have personally reviewed the patients medication history on the [place] controlled substance database.    Physical Exam: General:  Alert, NAD, pleasant, comfortable Pulm: rate and effort normal Abd:  Soft, ND, appropriately tender, multiple lap incisions C/D/I, +BS  Allergies as of 03/28/2020      Reactions   Latex Other (See Comments)   Patient states she is not allergic to Latex unknown      Medication List    TAKE these medications   acetaminophen 325 MG tablet Commonly known as: TYLENOL Take 2 tablets (650 mg total) by mouth every 6 (six) hours as needed for mild pain (or temp > 100).   ibuprofen 200 MG tablet Commonly known as: ADVIL Take 3 tablets (600  mg  total) by mouth every 6 (six) hours as needed for mild pain (use if tylenol ineffective).   oxyCODONE 5 MG immediate release tablet Commonly known as: Oxy IR/ROXICODONE Take 1 tablet (5 mg total) by mouth every 6 (six) hours as needed for severe pain.   polyethylene glycol 17 g packet Commonly known as: MIRALAX / GLYCOLAX Take 17 g by mouth daily as needed for mild constipation.         Follow-up Information    [facility], [place]. Call.   Specialty: General Surgery Why: We are working on your appointment, call to confirm Please arrive 30 minutes prior to your appointment to check in and fill out paperwork. Bring photo ID and insurance information. Contact information: [address] [place] [address] [phone]              Signed: Franne Forts, [facility] 03/28/2020, 8:21 AM Please see Amion for pager number during day hours 7:00am-4:30pm

## 2021-12-02 IMAGING — US US ABDOMEN LIMITED
1 series · 14 of 25 positions shown · non-contrast
Comparison: None.

CLINICAL DATA: Right upper quadrant pain

EXAM:
ULTRASOUND ABDOMEN LIMITED RIGHT UPPER QUADRANT

[Series 1: us abdomen limited · 14 of 33 slices shown]
[im 1/33]
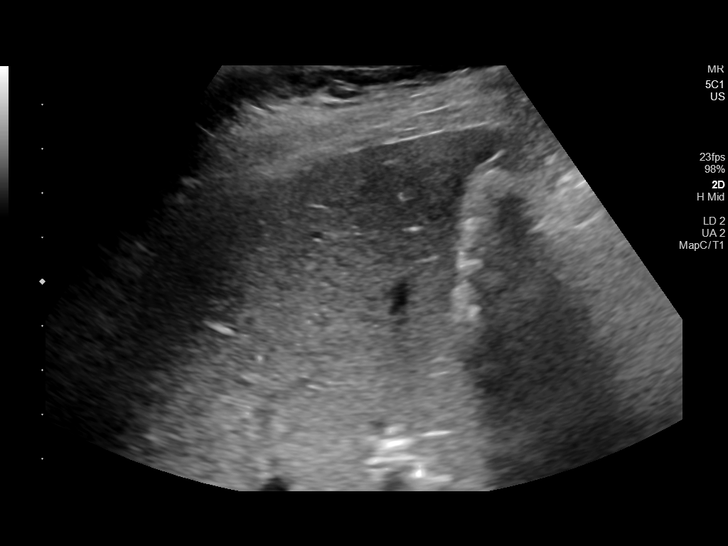
[im 3/33]
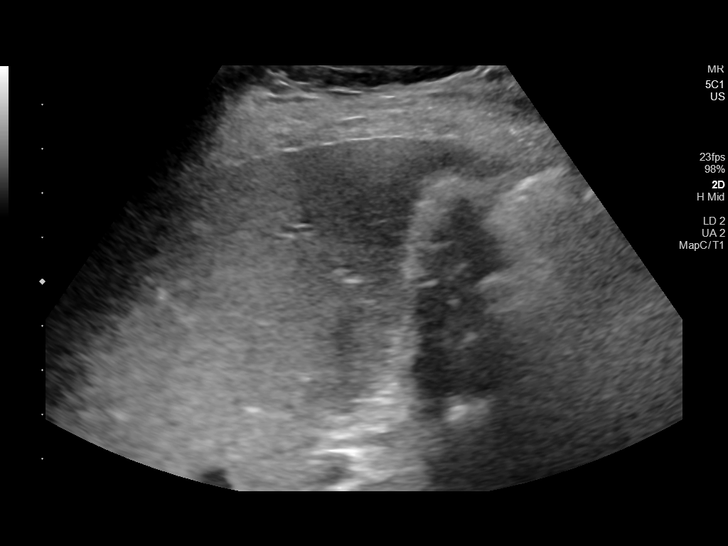
[im 6/33]
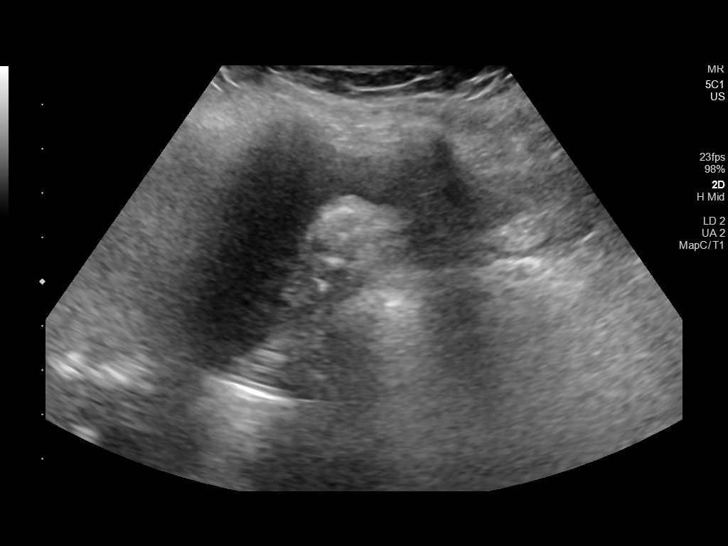
[im 9/33]
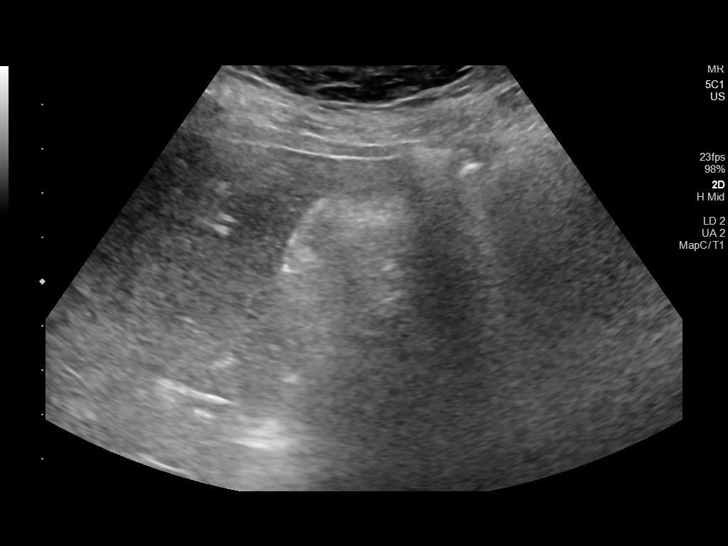
[im 11/33]
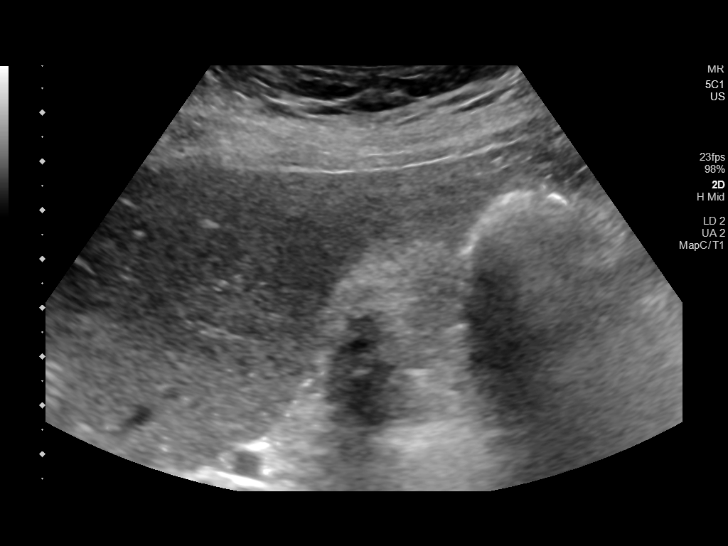
[im 13/33]
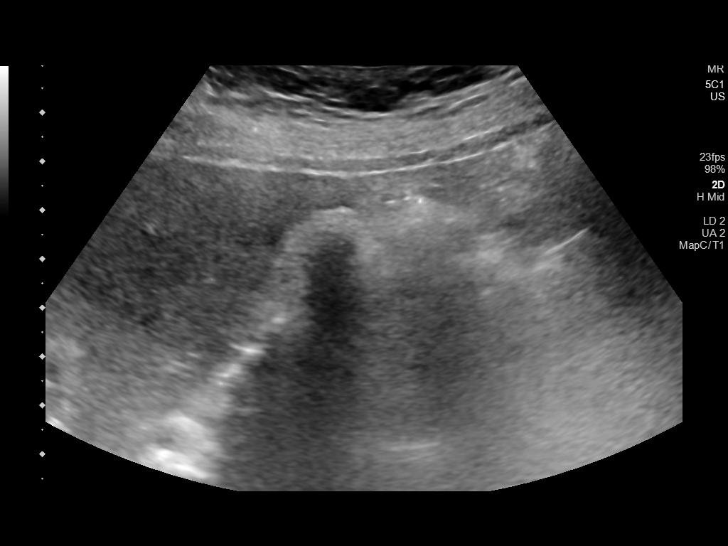
[im 15/33]
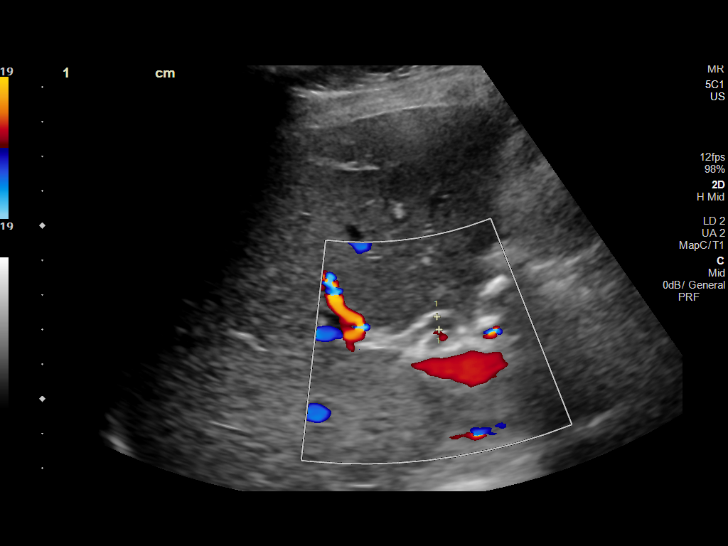
[im 18/33]
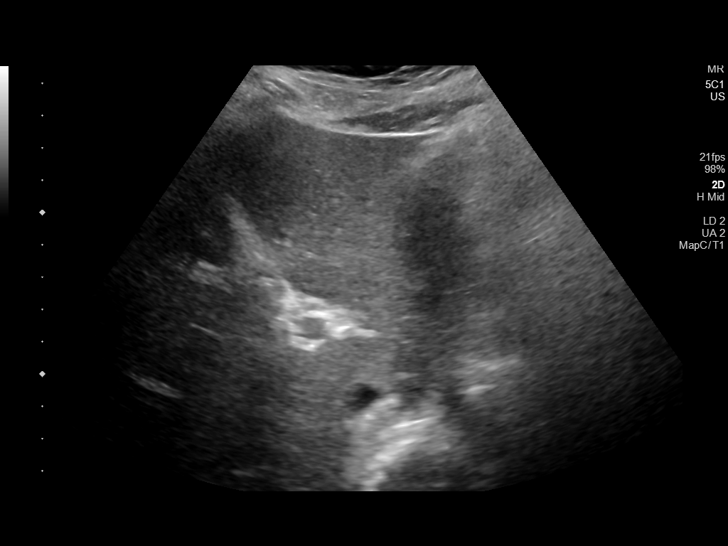
[im 21/33]
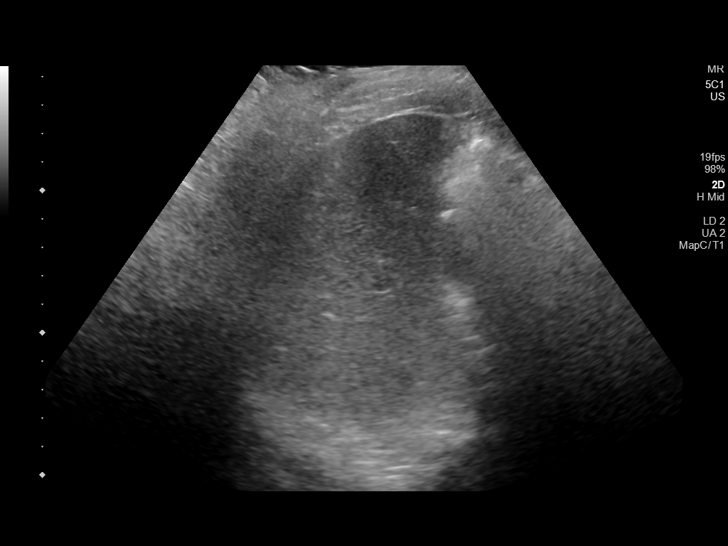
[im 22/33]
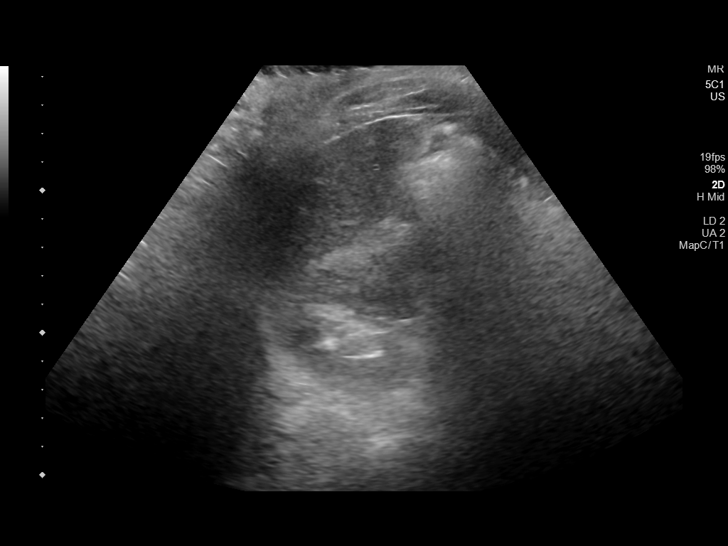
[im 25/33]
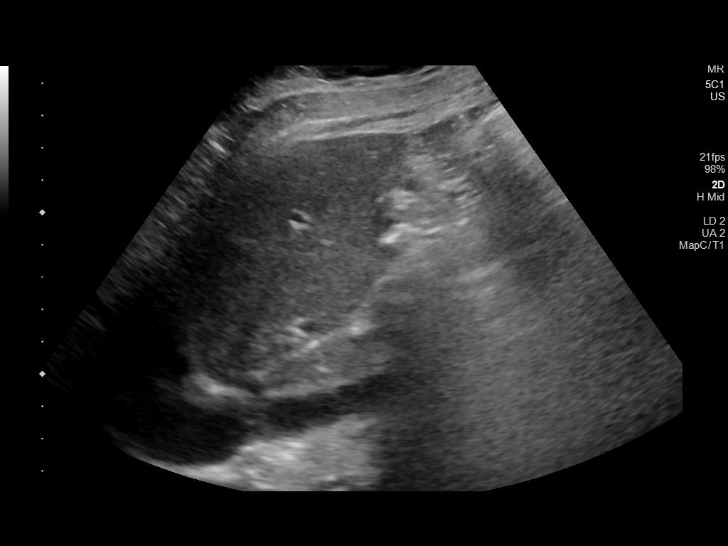
[im 27/33]
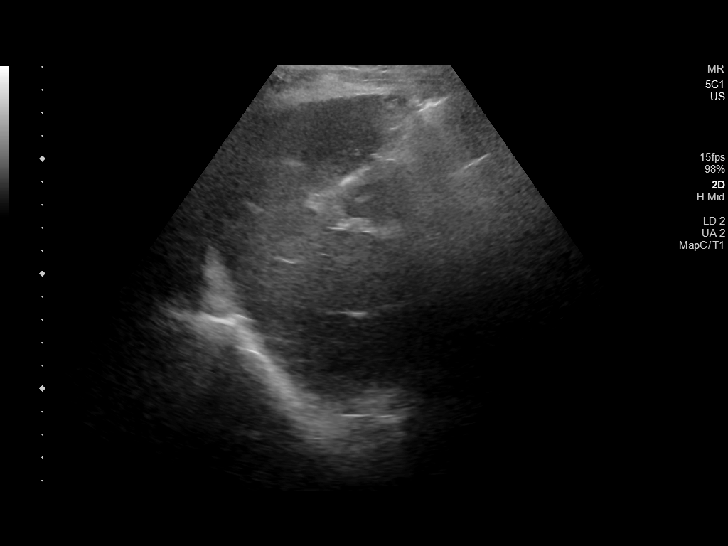
[im 30/33]
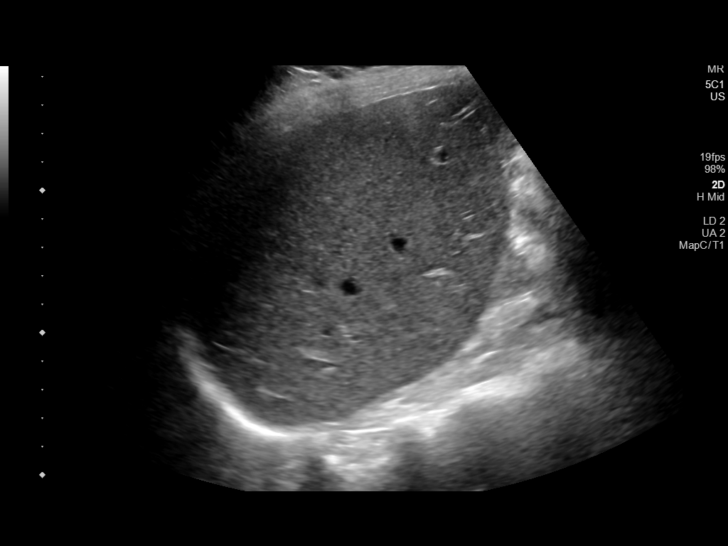
[im 33/33]
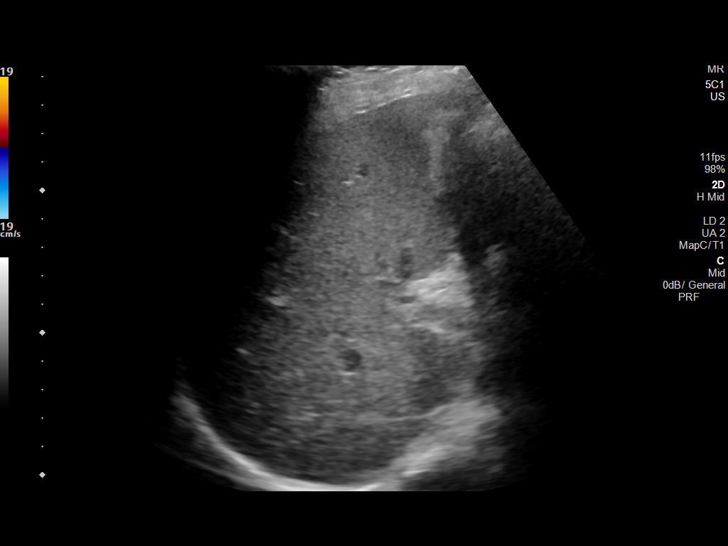

[14 of 25 positions shown; findings below may reference images not displayed]

FINDINGS: Gallbladder:

Gallbladder is contracted and filled with calculi and sludge.
Gallbladder wall appears somewhat thickened. No pericholecystic
fluid. No sonographic Murphy sign noted by sonographer.

Common bile duct:

Diameter: 4 mm. No intrahepatic or extrahepatic biliary duct
dilatation.

Liver:

No focal lesion identified. Within normal limits in parenchymal
echogenicity. Portal vein is patent on color Doppler imaging with
normal direction of blood flow towards the liver.

Other: None.
IMPRESSION: Gallbladder contracted and filled with calculi and sludge. The
gallbladder wall is thickened. These findings are concerning for
potential degree of acute cholecystitis.

Study otherwise unremarkable.

## 2021-12-03 IMAGING — RF DG CHOLANGIOGRAM OPERATIVE
1 series · 8 of 8 positions shown · non-contrast
Comparison: Right upper quadrant abdominal ultrasound-03/26/2020

CLINICAL DATA: Intraoperative cholangiogram during laparoscopic
cholecystectomy.

EXAM:
INTRAOPERATIVE CHOLANGIOGRAM
FLUOROSCOPY TIME:  23 seconds

[Series 1: run · 2 acquisitions, 8 frames shown]
[im 1/2]
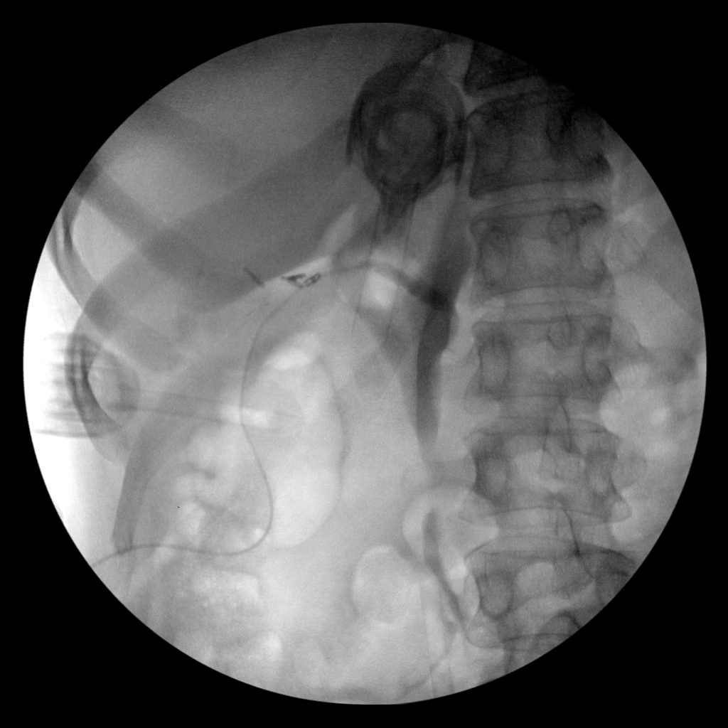
[im 1/2]
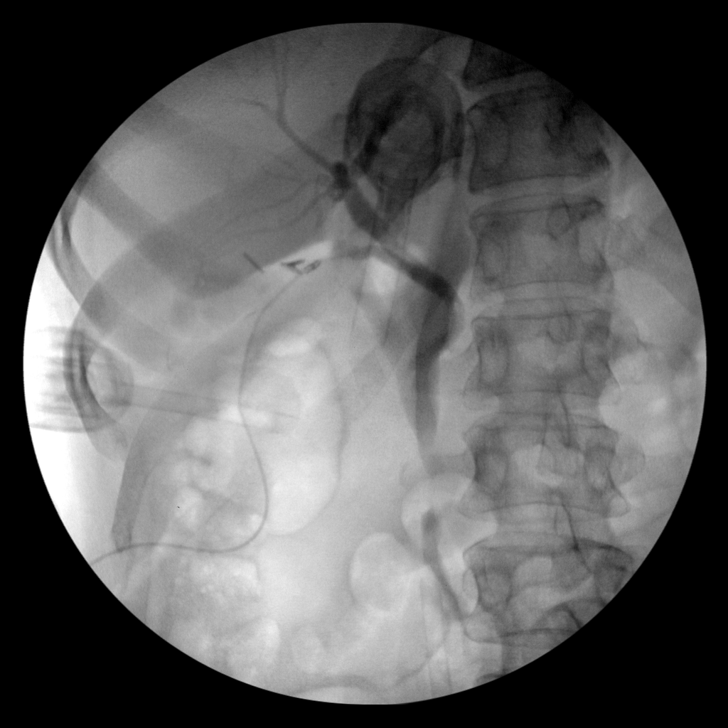
[im 1/2]
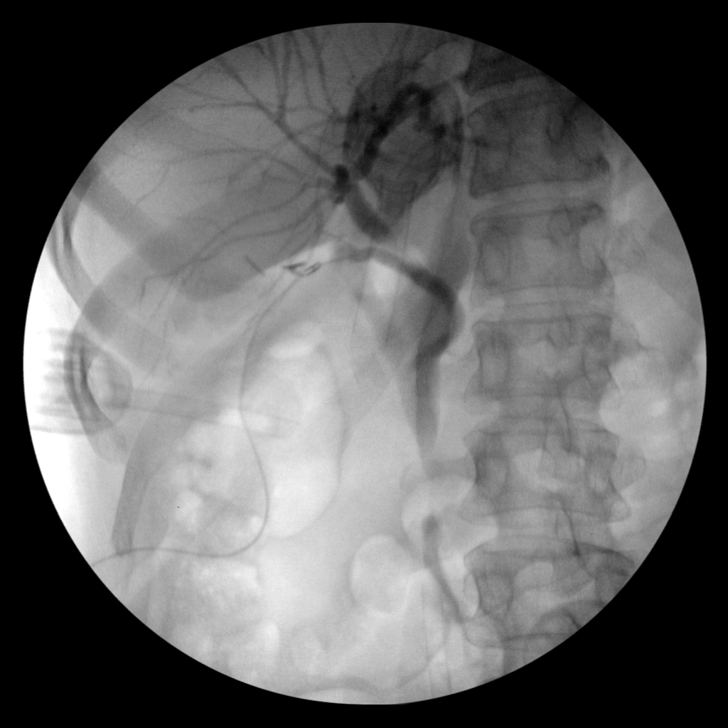
[im 1/2]
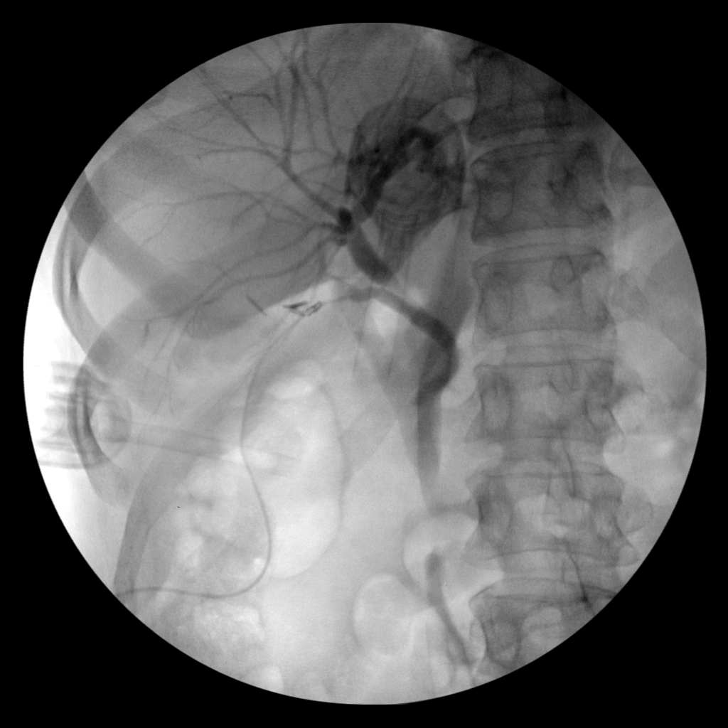
[im 2/2]
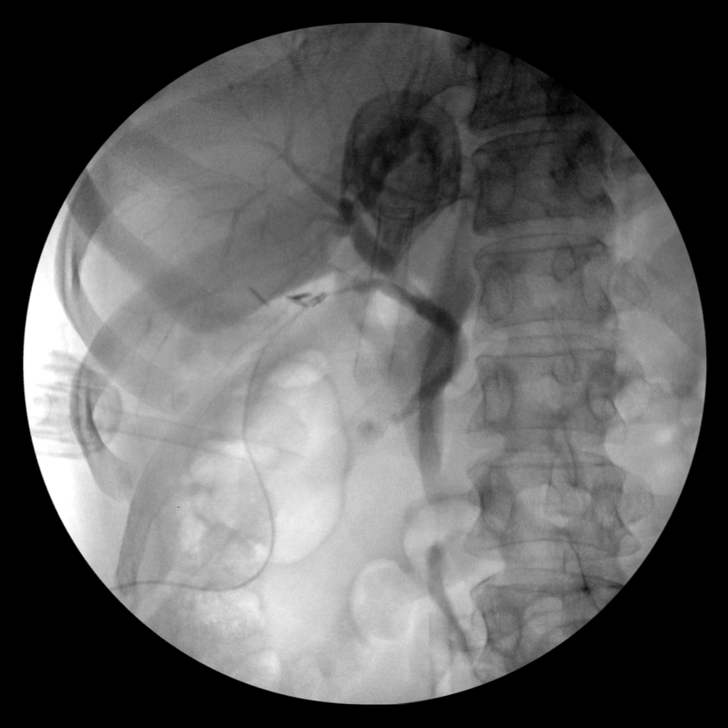
[im 2/2]
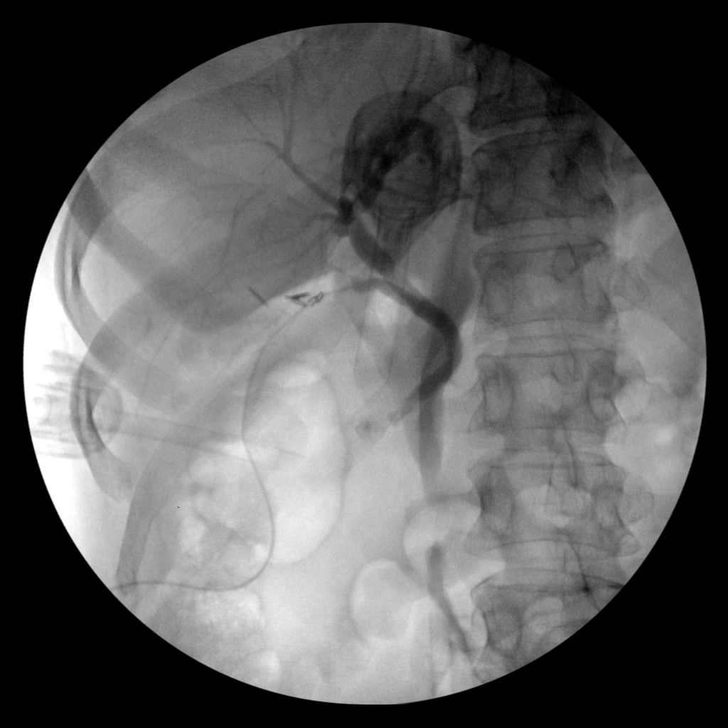
[im 2/2]
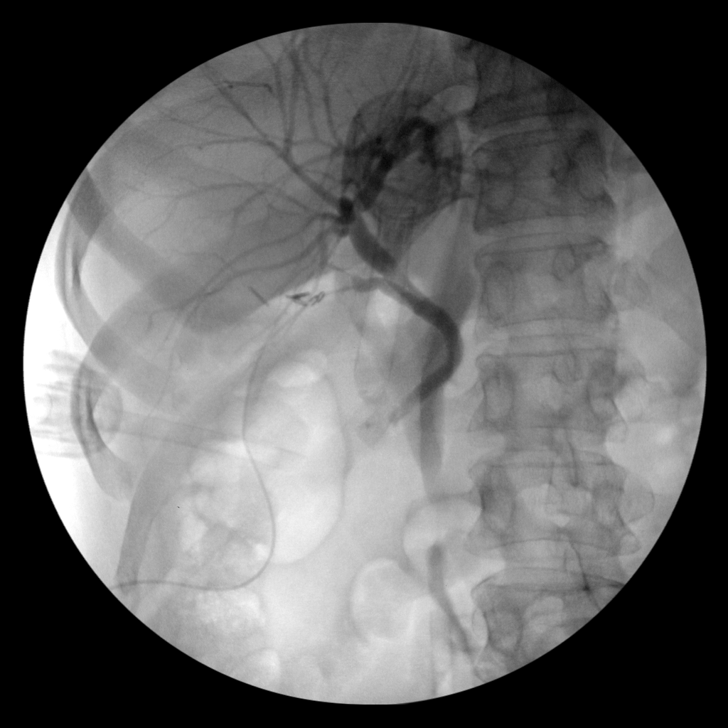
[im 2/2]
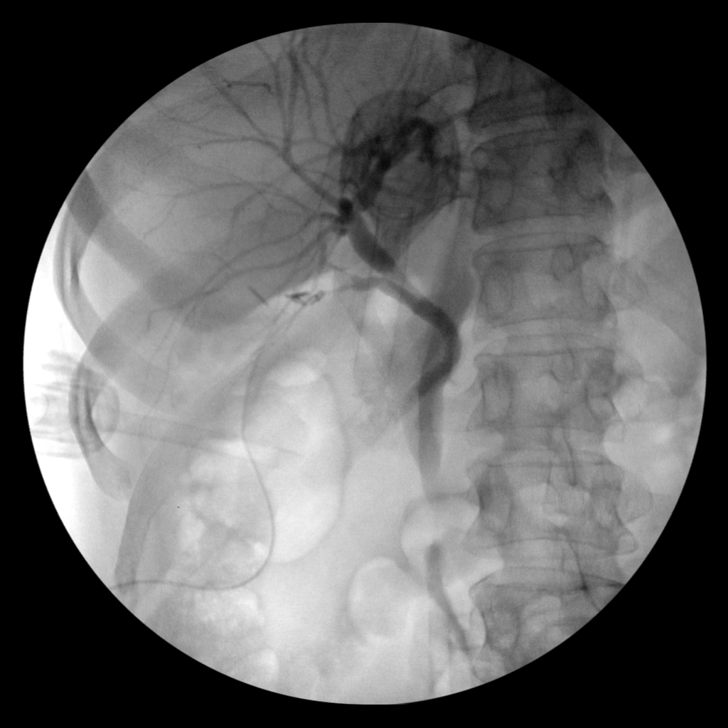

[8 of 8 positions shown; findings below may reference images not displayed]

FINDINGS: Intraoperative cholangiographic images of the right upper abdominal
quadrant during laparoscopic cholecystectomy are provided for
review.

Surgical clips overlie the expected location of the gallbladder
fossa.

Contrast injection demonstrates selective cannulation of the central
aspect of the cystic duct.

There is passage of contrast through the central aspect of the
cystic duct with filling of a non dilated common bile duct.

There is a ventral passage of contrast though the CBD and into the
descending portion of the duodenum.

There is minimal reflux of injected contrast into the common hepatic
duct and central aspect of the non dilated intrahepatic biliary
system.

There are no discrete filling defects within the opacified portions
of the biliary system to suggest the presence of
choledocholithiasis.
IMPRESSION: No definite evidence of choledocholithiasis.
